# Patient Record
Sex: Male | Born: 1975 | Race: White | Hispanic: No | Marital: Married | Smoking: Never smoker
Health system: Southern US, Community
[De-identification: ages and names within clinical notes are randomized; demographics above are authoritative.]

## PROBLEM LIST (undated history)

## (undated) HISTORY — PX: SPLENECTOMY, TOTAL: SHX788

---

## 2016-05-20 ENCOUNTER — Encounter (HOSPITAL_BASED_OUTPATIENT_CLINIC_OR_DEPARTMENT_OTHER): Payer: Self-pay | Admitting: *Deleted

## 2016-05-20 ENCOUNTER — Emergency Department (HOSPITAL_BASED_OUTPATIENT_CLINIC_OR_DEPARTMENT_OTHER): Payer: No Typology Code available for payment source

## 2016-05-20 ENCOUNTER — Emergency Department (HOSPITAL_BASED_OUTPATIENT_CLINIC_OR_DEPARTMENT_OTHER)
Admission: EM | Admit: 2016-05-20 | Discharge: 2016-05-20 | Disposition: A | Discharge status: Home / Self Care | Payer: No Typology Code available for payment source | Attending: Emergency Medicine | Admitting: Emergency Medicine

## 2016-05-20 DIAGNOSIS — Z791 Long term (current) use of non-steroidal anti-inflammatories (NSAID): Secondary | ICD-10-CM | POA: Insufficient documentation

## 2016-05-20 DIAGNOSIS — R109 Unspecified abdominal pain: Secondary | ICD-10-CM | POA: Diagnosis present

## 2016-05-20 DIAGNOSIS — N200 Calculus of kidney: Secondary | ICD-10-CM | POA: Diagnosis not present

## 2016-05-20 LAB — BASIC METABOLIC PANEL
Anion gap: 8 (ref 5–15)
BUN: 22 mg/dL — ABNORMAL HIGH (ref 6–20)
CALCIUM: 9.2 mg/dL (ref 8.9–10.3)
CO2: 26 mmol/L (ref 22–32)
CREATININE: 1.18 mg/dL (ref 0.61–1.24)
Chloride: 105 mmol/L (ref 101–111)
GFR calc Af Amer: >60 mL/min (ref >60)
GFR calc non Af Amer: >60 mL/min (ref >60)
GLUCOSE: 107 mg/dL — AB (ref 65–99)
Potassium: 3.8 mmol/L (ref 3.5–5.1)
Sodium: 139 mmol/L (ref 135–145)

## 2016-05-20 LAB — CBC WITH DIFFERENTIAL/PLATELET
Basophils Absolute: 0.1 K/uL (ref 0.0–0.1)
Basophils Relative: 0 %
Eosinophils Absolute: 0.4 K/uL (ref 0.0–0.7)
Eosinophils Relative: 2 %
HCT: 43.9 % (ref 39.0–52.0)
Hemoglobin: 16 g/dL (ref 13.0–17.0)
Lymphocytes Relative: 12 %
Lymphs Abs: 2.3 K/uL (ref 0.7–4.0)
MCH: 31.1 pg (ref 26.0–34.0)
MCHC: 36.4 g/dL — ABNORMAL HIGH (ref 30.0–36.0)
MCV: 85.2 fL (ref 78.0–100.0)
Monocytes Absolute: 1.8 K/uL — ABNORMAL HIGH (ref 0.1–1.0)
Monocytes Relative: 10 %
Neutro Abs: 14.7 K/uL — ABNORMAL HIGH (ref 1.7–7.7)
Neutrophils Relative %: 76 %
Platelets: 446 K/uL — ABNORMAL HIGH (ref 150–400)
RBC: 5.15 MIL/uL (ref 4.22–5.81)
RDW: 13.4 % (ref 11.5–15.5)
WBC: 19.3 K/uL — ABNORMAL HIGH (ref 4.0–10.5)

## 2016-05-20 LAB — URINALYSIS, ROUTINE W REFLEX MICROSCOPIC
GLUCOSE, UA: NEGATIVE mg/dL
KETONES UR: NEGATIVE mg/dL
Leukocytes, UA: NEGATIVE
Nitrite: NEGATIVE
PH: 5.5 (ref 5.0–8.0)
Protein, ur: NEGATIVE mg/dL
Specific Gravity, Urine: 1.028 (ref 1.005–1.030)

## 2016-05-20 LAB — URINE MICROSCOPIC-ADD ON

## 2016-05-20 MED ORDER — TAMSULOSIN HCL 0.4 MG PO CAPS: 0.4000 mg | ORAL_CAPSULE | Freq: Every day | ORAL | 0 refills | Status: AC

## 2016-05-20 MED ORDER — OXYCODONE-ACETAMINOPHEN 5-325 MG PO TABS: 1.0000 | ORAL_TABLET | Freq: Four times a day (QID) | ORAL | 0 refills | Status: AC | PRN

## 2016-05-20 MED ORDER — ONDANSETRON HCL 4 MG PO TABS
4.0000 mg | ORAL_TABLET | Freq: Four times a day (QID) | ORAL | 0 refills | Status: AC
Start: 2016-05-20 — End: ?

## 2016-05-20 MED ORDER — ONDANSETRON HCL 4 MG/2ML IJ SOLN
4.0000 mg | Freq: Once | INTRAMUSCULAR | Status: AC
  Administered 2016-05-20: 4 mg via INTRAVENOUS
  Filled 2016-05-20: qty 2

## 2016-05-20 MED ORDER — MORPHINE SULFATE (PF) 4 MG/ML IV SOLN
4.0000 mg | Freq: Once | INTRAVENOUS | Status: AC
  Administered 2016-05-20: 4 mg via INTRAVENOUS
  Filled 2016-05-20: qty 1

## 2016-05-20 MED ORDER — KETOROLAC TROMETHAMINE 30 MG/ML IJ SOLN
30.0000 mg | Freq: Once | INTRAMUSCULAR | Status: AC
  Administered 2016-05-20: 30 mg via INTRAVENOUS
  Filled 2016-05-20: qty 1

## 2016-05-20 MED ORDER — SODIUM CHLORIDE 0.9 % IV BOLUS (SEPSIS)
1000.0000 mL | Freq: Once | INTRAVENOUS | Status: AC
  Administered 2016-05-20: 1000 mL via INTRAVENOUS

## 2016-05-20 MED FILL — TAMSULOSIN HCL 0.4 MG CAP: 0.4 | 15 days supply | Qty: 15 | Fill #0

## 2016-05-20 MED FILL — ONDANSETRON HCL 4 MG TABLET: 4 | 3 days supply | Qty: 12 | Fill #0

## 2016-05-20 MED FILL — OXYCODONE/APAP 5-325: 5-325 | 2 days supply | Qty: 20 | Fill #0

## 2016-05-20 NOTE — ED Provider Notes (Signed)
[facility] Provider Note   CSN: [id number] Arrival date & time: 05/20/16  1424  First Provider Contact:  None       History   Chief Complaint Chief Complaint  Patient presents with  . Flank Pain    HPI Rajai Wertman is a 40 y.o. male.  Patient presents today with a chief complaint of right sided flank pain.  He reports acute onset of pain around 11 AM today.  He states that he took Ibuprofen for the pain, which helped temporarily.  However, the pain then became much worse prior to arrival.  He states that the pain radiates to the right lower abdomen.  He denies any urinary symptoms.  Denies fever or chills.  Reports associated nausea, but no vomiting.  Denies any other symptoms.  No prior history of kidney stones.      History reviewed. No pertinent past medical history.  There are no active problems to display for this patient.   Past Surgical History:  Procedure Laterality Date  . SPLENECTOMY, TOTAL         Home Medications    Prior to Admission medications   Medication Sig Start Date End Date Taking? Authorizing Provider  ibuprofen (ADVIL,MOTRIN) 600 MG tablet Take 600 mg by mouth every 6 (six) hours as needed.   Yes Historical Provider, MD    Family History History reviewed. No pertinent family history.  Social History Social History  Substance Use Topics  . Smoking status: Never Smoker  . Smokeless tobacco: Not on file  . Alcohol use No     Allergies   Review of patient's allergies indicates no known allergies.   Review of Systems Review of Systems  All other systems reviewed and are negative.    Physical Exam Updated Vital Signs BP (!) 143/104 (BP Location: Left Arm)   Pulse 78   Temp 98.9 F (37.2 C)   Resp 20   Ht 5\' 8"  (1.727 m)   Wt 136.1 kg   SpO2 99%   BMI 45.61 kg/m   Physical Exam  Constitutional: He appears well-developed and well-nourished.  HENT:  Head: Normocephalic and atraumatic.  Neck: Normal  range of motion. Neck supple.  Cardiovascular: Normal rate and regular rhythm.   Pulmonary/Chest: Effort normal and breath sounds normal.  Abdominal: Soft. Bowel sounds are normal. He exhibits no distension and no mass. There is no tenderness. There is CVA tenderness. There is no rebound and no guarding. No hernia.  Right CVA tenderness  Musculoskeletal: Normal range of motion.  Neurological: He is alert.  Skin: Skin is warm and dry.  Nursing note and vitals reviewed.    ED Treatments / Results  Labs (all labs ordered are listed, but only abnormal results are displayed) Labs Reviewed  URINE CULTURE  URINALYSIS, ROUTINE W REFLEX MICROSCOPIC (NOT AT [facility])  BASIC METABOLIC PANEL  CBC WITH DIFFERENTIAL/PLATELET    EKG  EKG Interpretation None       Radiology Ct Renal Stone Study  Result Date: 05/20/2016 CLINICAL DATA:  Acute right flank pain. EXAM: CT ABDOMEN AND PELVIS WITHOUT CONTRAST TECHNIQUE: Multidetector CT imaging of the abdomen and pelvis was performed following the standard protocol without IV contrast. COMPARISON:  None. FINDINGS: Visualized lung bases are unremarkable. No significant osseous abnormality is noted. No gallstones are noted. No focal abnormality is noted in the liver or pancreas. Status post splenectomy. Small nonobstructive calculus is noted in lower pole collecting system of right kidney. Mild right hydroureteronephrosis is noted  secondary to 5 mm calculus in proximal right ureter just beyond the ureteropelvic junction. Left kidney and ureter appear normal. The appendix appears normal. There is no evidence of bowel obstruction. No abnormal fluid collection is noted. Urinary bladder is decompressed. No significant adenopathy is noted. IMPRESSION: Small nonobstructive calculus seen in right kidney. Mild right hydronephrosis is noted secondary to 5 mm calculus in proximal right ureter. Electronically Signed   By: Lupita Raider, M.D.   On: 05/20/2016 15:12     Procedures Procedures (including critical care time)  Medications Ordered in ED Medications  morphine 4 MG/ML injection 4 mg (not administered)  ondansetron (ZOFRAN) injection 4 mg (not administered)  sodium chloride 0.9 % bolus 1,000 mL (not administered)     Initial Impression / Assessment and Plan / ED Course  I have reviewed the triage vital signs and the nursing notes.  Pertinent labs & imaging results that were available during my care of the patient were reviewed by me and considered in my medical decision making (see chart for details).  Clinical Course   4:10 PM Reassessed patient.  Pain has improved at this time.  Final Clinical Impressions(s) / ED Diagnoses   Final diagnoses:  None    New Prescriptions New Prescriptions   No medications on file   Patient presents today with acute onset of right flank pain radiating to his right lower abdomen.  UA negative for infection.  Creatine WNL.  CT showing 5 mm calculus in proximal right ureter.  Pain and nausea controlled in the ED.  Patient stable for discharge.  Patient given Rx for Percocet, Flomax, and Zofran.  Given referral to Urology.  Stable for discharge.  Return precautions given.   Santiago Glad, PA-C 05/21/16 2127    Gwyneth Sprout, MD 05/29/16 2203

## 2016-05-20 NOTE — ED Triage Notes (Signed)
Pt c/o right flank pain radiates to lower abd x 2 hrs

## 2016-05-21 LAB — URINE CULTURE: CULTURE: NO GROWTH

## 2017-04-10 IMAGING — CT CT RENAL STONE PROTOCOL
2 of 4 series · 17 of 46 positions shown, 19 images · non-contrast
Comparison: None.

CLINICAL DATA: Acute right flank pain.

EXAM:
CT ABDOMEN AND PELVIS WITHOUT CONTRAST
TECHNIQUE: Multidetector CT imaging of the abdomen and pelvis was performed
following the standard protocol without IV contrast.

[Series 2: axial st · axial · 0.98mm/px · z∈[-531,-76]mm · 14 of 101 slices shown, 16 images]
[im 5/101  soft-tissue]
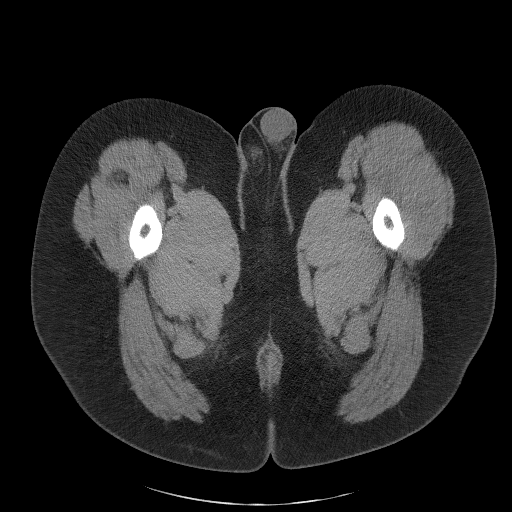
[im 5/101  bone]
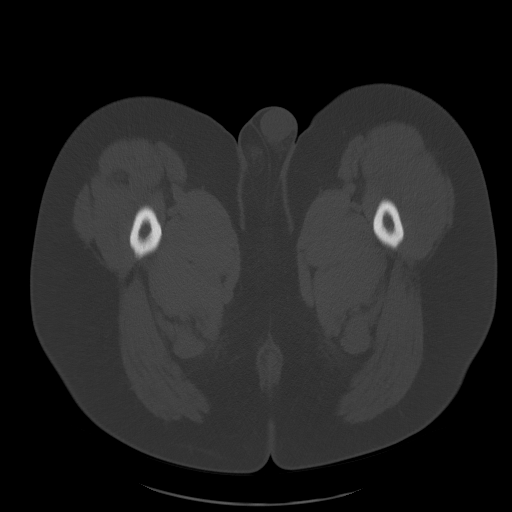
[im 13/101  soft-tissue]
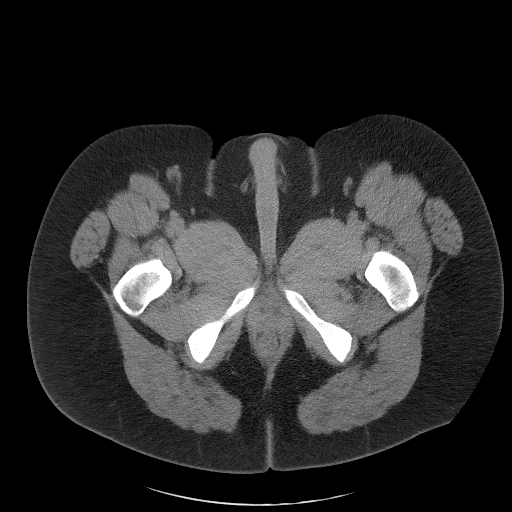
[im 21/101  soft-tissue]
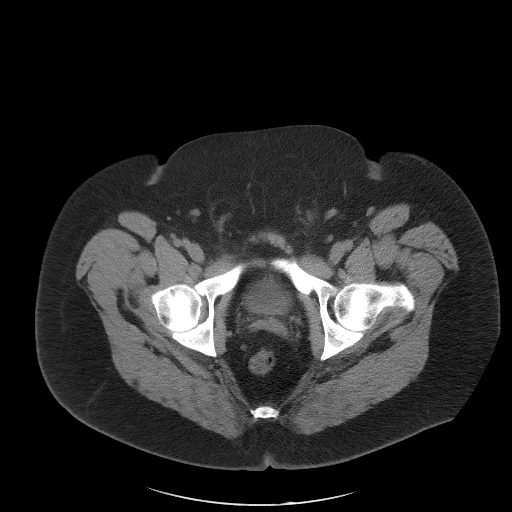
[im 26/101  soft-tissue]
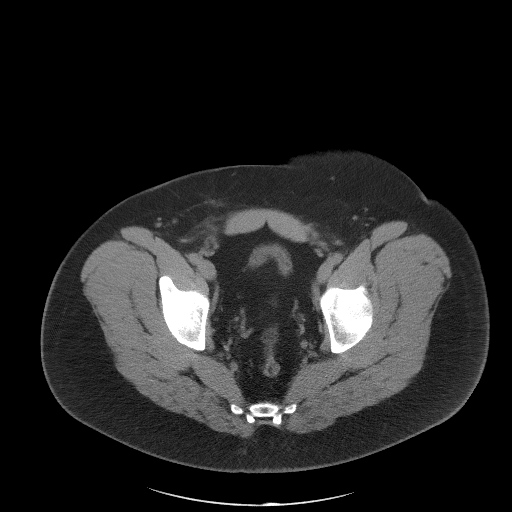
[im 34/101  soft-tissue]
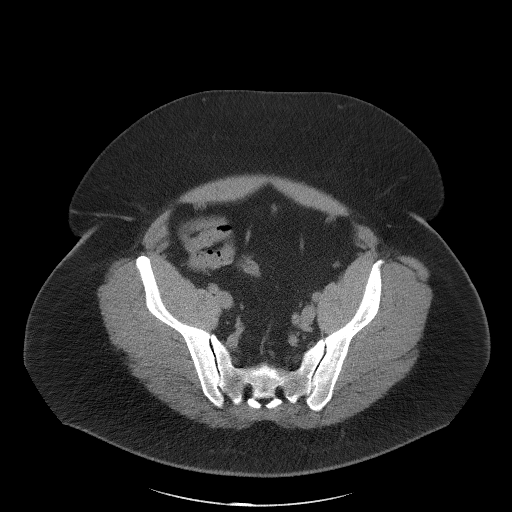
[im 42/101  soft-tissue]
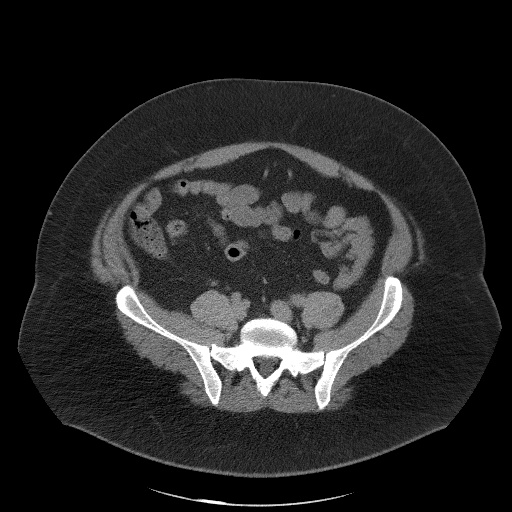
[im 46/101  soft-tissue]
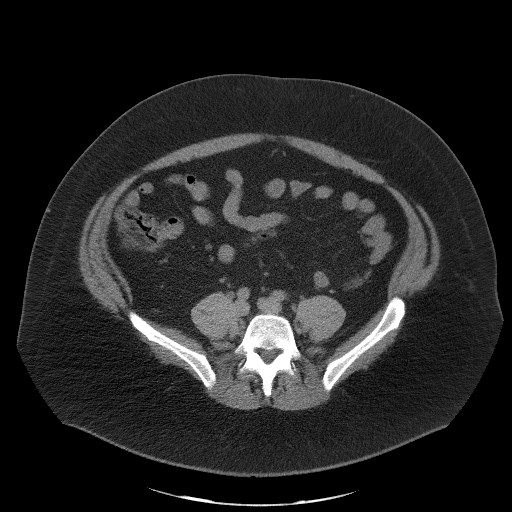
[im 55/101  soft-tissue]
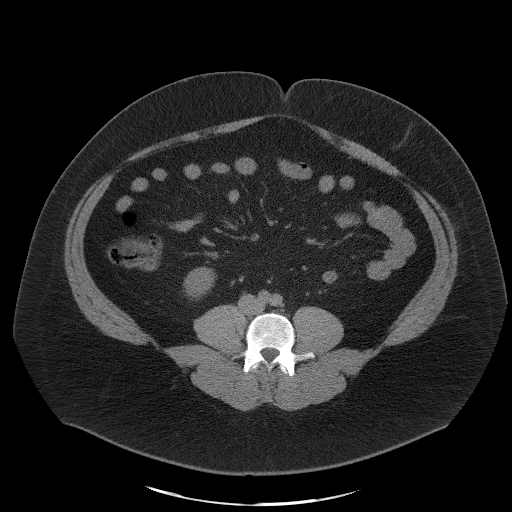
[im 59/101  soft-tissue]
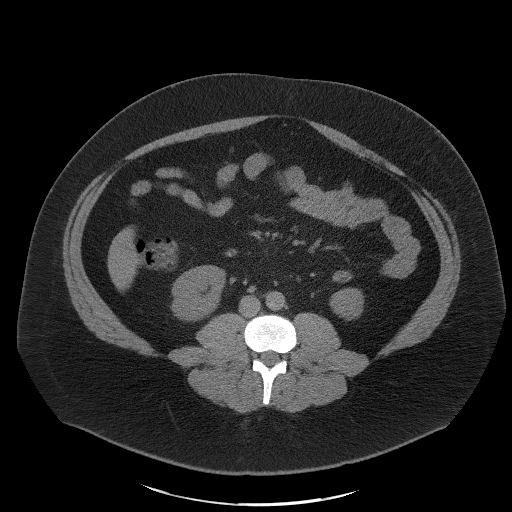
[im 59/101  bone]
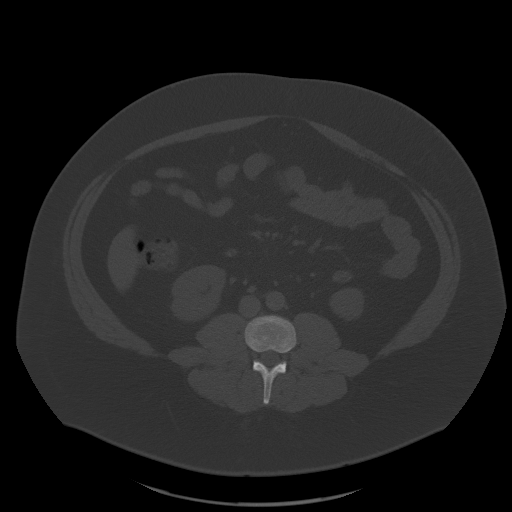
[im 67/101  soft-tissue]
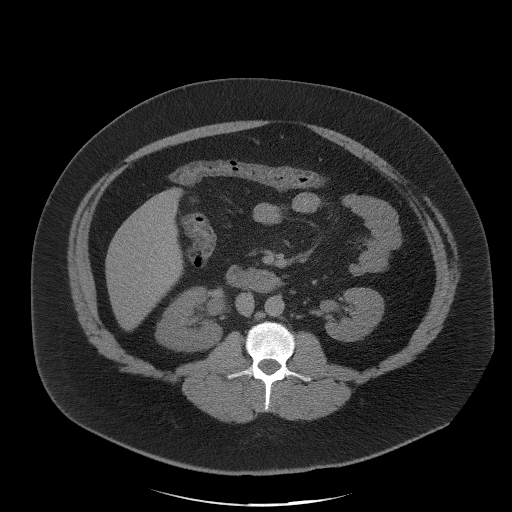
[im 76/101  soft-tissue]
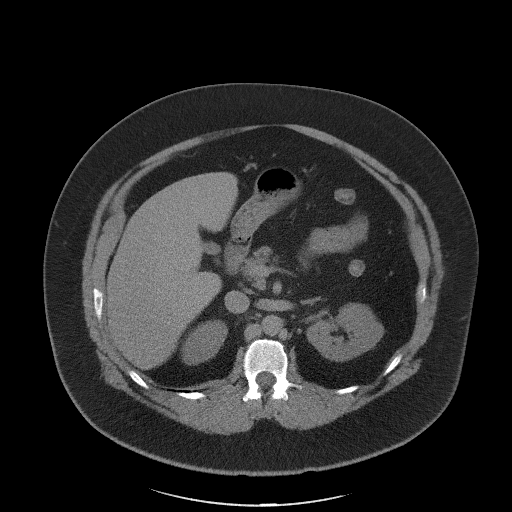
[im 80/101  soft-tissue]
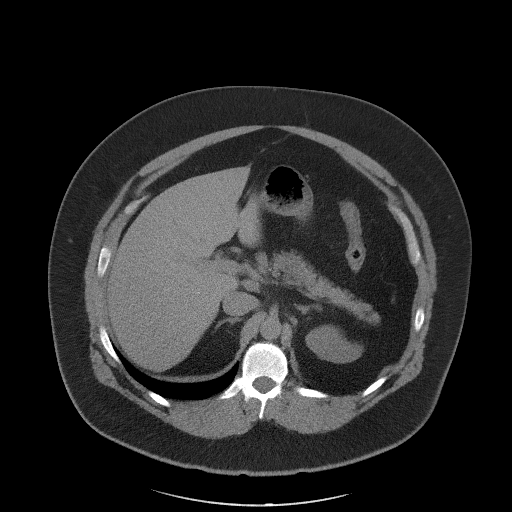
[im 88/101  soft-tissue]
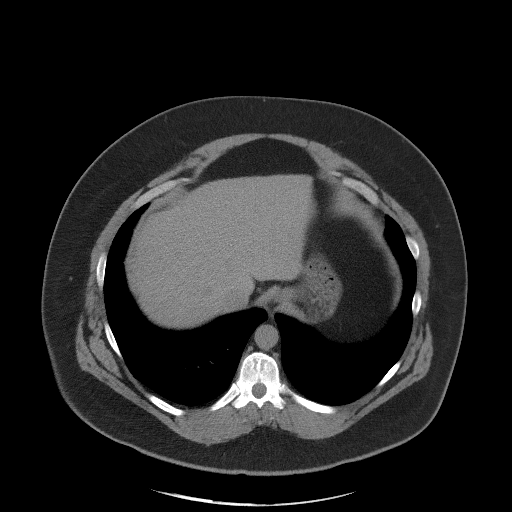
[im 96/101  soft-tissue]
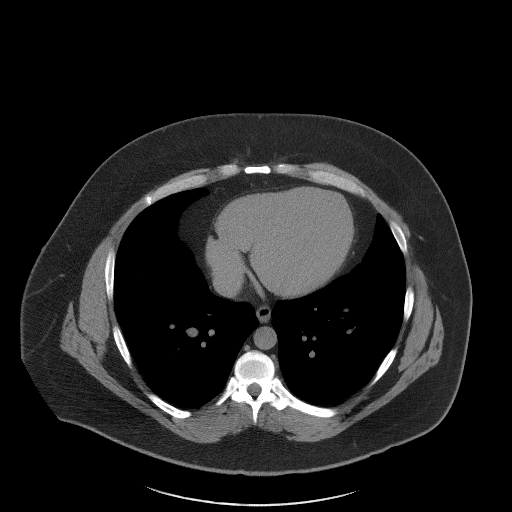

[Series 5: coronal st · coronal · 1.07mm/px · 3 of 112 slices shown]
[im 38/112  soft-tissue]
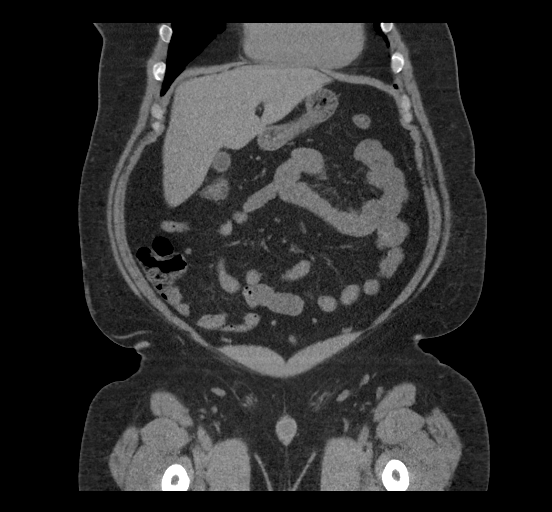
[im 50/112  soft-tissue]
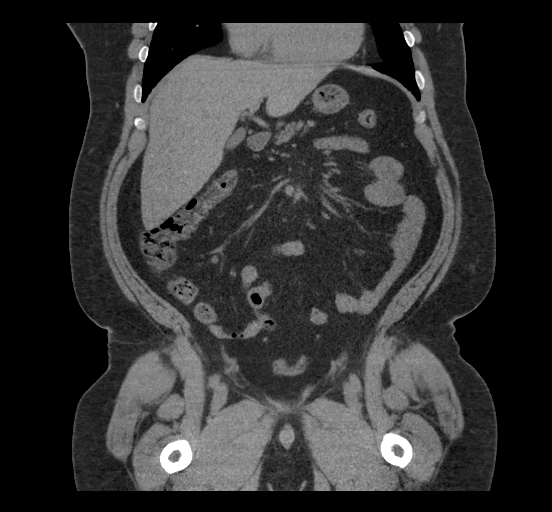
[im 62/112  soft-tissue]
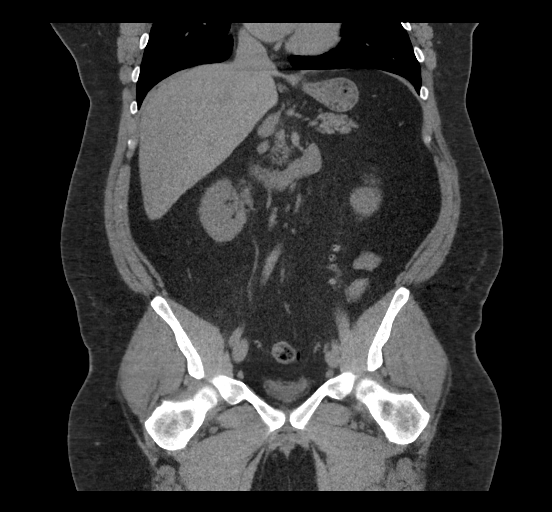

[17 of 46 positions shown; findings below may reference images not displayed]

FINDINGS: Visualized lung bases are unremarkable. No significant osseous
abnormality is noted.

No gallstones are noted. No focal abnormality is noted in the liver
or pancreas. Status post splenectomy. Small nonobstructive calculus
is noted in lower pole collecting system of right kidney. Mild right
hydroureteronephrosis is noted secondary to 5 mm calculus in
proximal right ureter just beyond the ureteropelvic junction. Left
kidney and ureter appear normal. The appendix appears normal. There
is no evidence of bowel obstruction. No abnormal fluid collection is
noted. Urinary bladder is decompressed. No significant adenopathy is
noted.
IMPRESSION: Small nonobstructive calculus seen in right kidney. Mild right
hydronephrosis is noted secondary to 5 mm calculus in proximal right
ureter.
